# Patient Record
Sex: Female | Born: 1945 | Race: White | Hispanic: No | Smoking: Never smoker
Health system: Southern US, Community
[De-identification: ages and names within clinical notes are randomized; demographics above are authoritative.]

## PROBLEM LIST (undated history)

## (undated) DIAGNOSIS — E079 Disorder of thyroid, unspecified: Secondary | ICD-10-CM

## (undated) HISTORY — DX: Disorder of thyroid, unspecified: E07.9

---
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
## 1991-04-26 HISTORY — PX: ABDOMINAL HYSTERECTOMY: SHX81

## 2000-07-25 ENCOUNTER — Encounter: Payer: Self-pay | Admitting: Obstetrics and Gynecology

## 2000-07-25 ENCOUNTER — Encounter: Admission: RE | Admit: 2000-07-25 | Discharge: 2000-07-25 | Payer: Self-pay | Admitting: Obstetrics and Gynecology

## 2000-09-13 ENCOUNTER — Other Ambulatory Visit: Admission: RE | Admit: 2000-09-13 | Discharge: 2000-09-13 | Payer: Self-pay | Admitting: Obstetrics and Gynecology

## 2001-09-21 ENCOUNTER — Other Ambulatory Visit: Admission: RE | Admit: 2001-09-21 | Discharge: 2001-09-21 | Payer: Self-pay | Admitting: Obstetrics and Gynecology

## 2003-09-01 ENCOUNTER — Encounter: Admission: RE | Admit: 2003-09-01 | Discharge: 2003-09-01 | Payer: Self-pay | Admitting: Obstetrics and Gynecology

## 2008-03-12 ENCOUNTER — Encounter: Admission: RE | Admit: 2008-03-12 | Discharge: 2008-03-12 | Payer: Self-pay | Admitting: Family Medicine

## 2008-03-19 ENCOUNTER — Encounter: Admission: RE | Admit: 2008-03-19 | Discharge: 2008-03-19 | Payer: Self-pay | Admitting: Family Medicine

## 2008-10-29 ENCOUNTER — Encounter: Admission: RE | Admit: 2008-10-29 | Discharge: 2008-10-29 | Payer: Self-pay | Admitting: Obstetrics and Gynecology

## 2008-11-04 ENCOUNTER — Encounter: Admission: RE | Admit: 2008-11-04 | Discharge: 2008-11-04 | Payer: Self-pay | Admitting: Obstetrics and Gynecology

## 2009-12-04 IMAGING — CR DG LUMBAR SPINE COMPLETE 4+V
6 series · 6 of 6 positions shown · non-contrast
Comparison: None

CLINICAL DATA: Pain from the buttocks to feet bilaterally.  Low
back pain.

LUMBAR SPINE - COMPLETE 4+ VIEW

[view not recorded (1 of 6)]
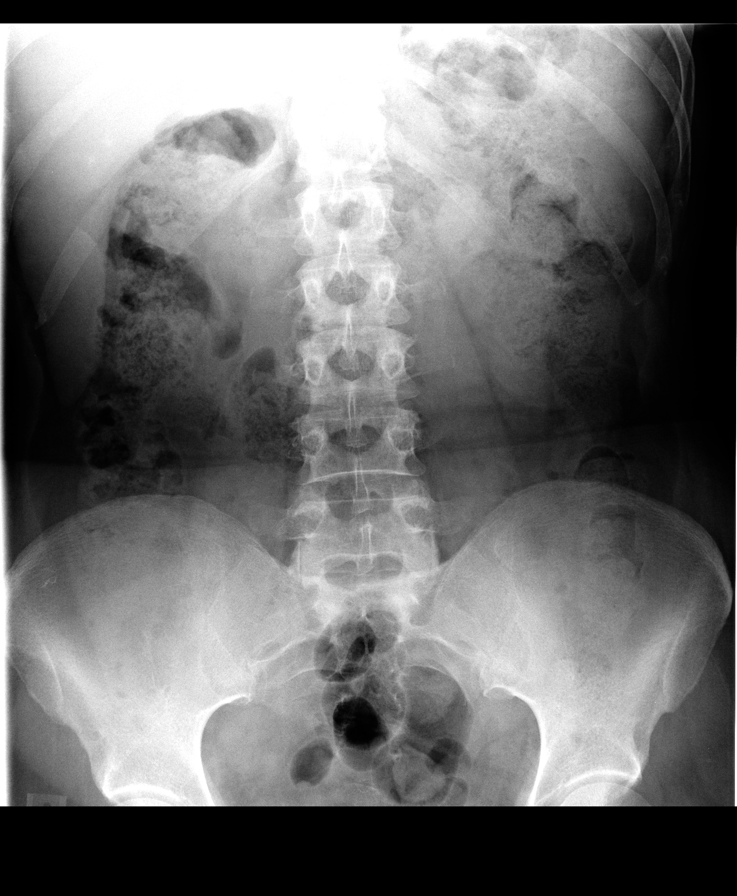

[view not recorded (2 of 6)]
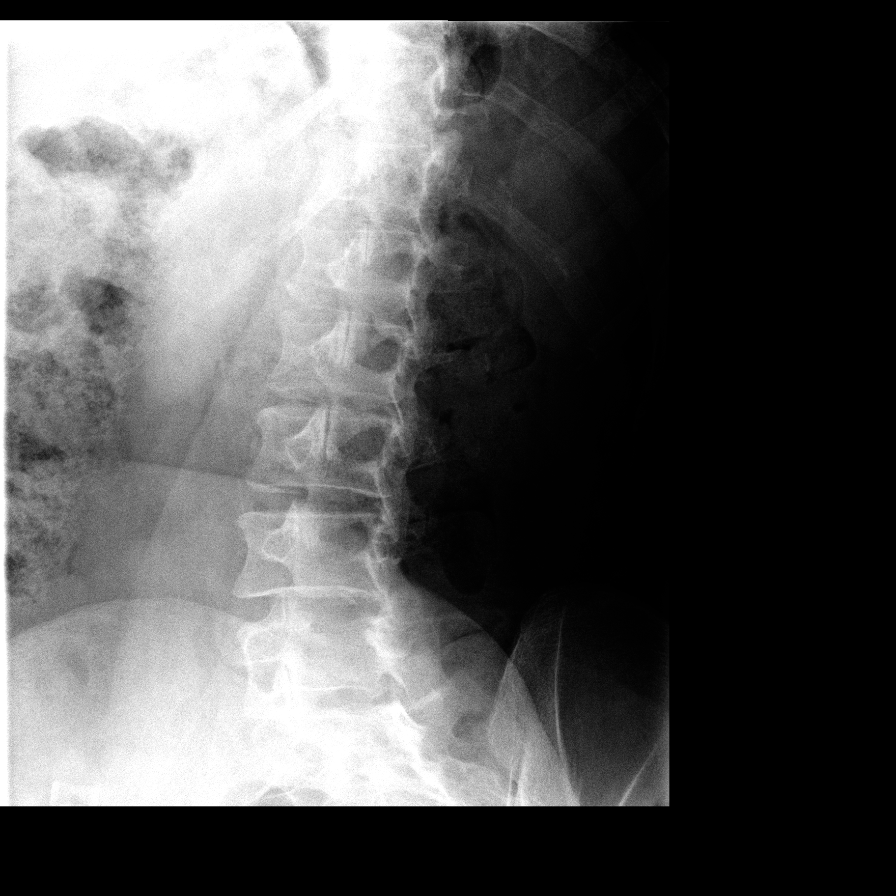

[view not recorded (3 of 6)]
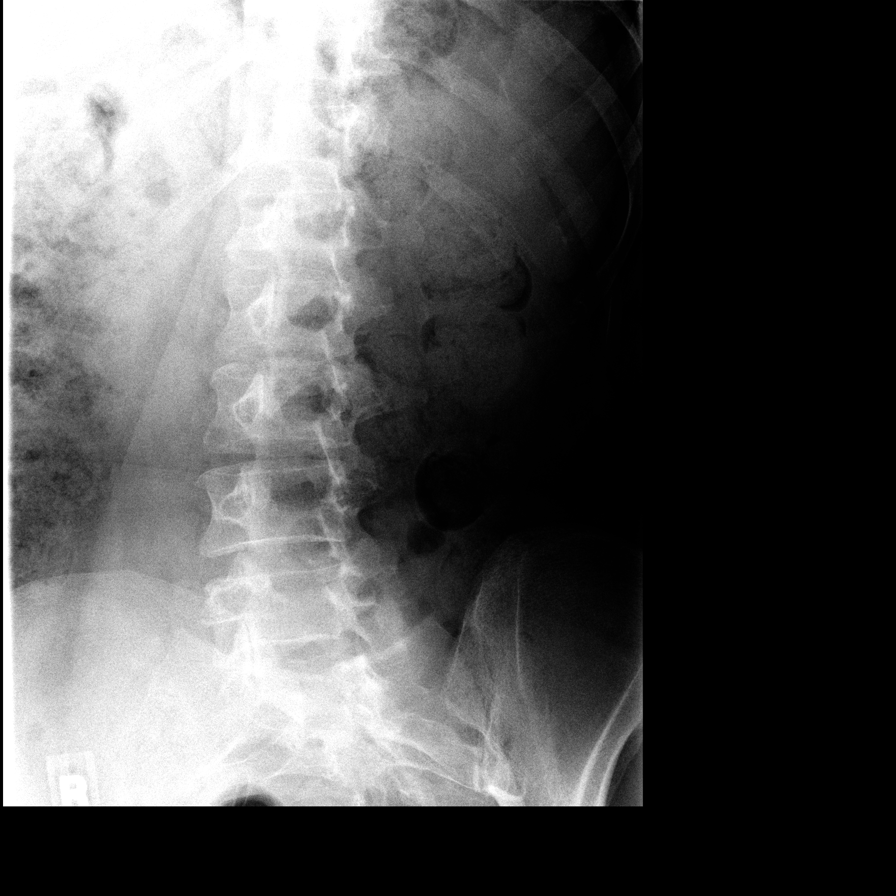

[view not recorded (4 of 6)]
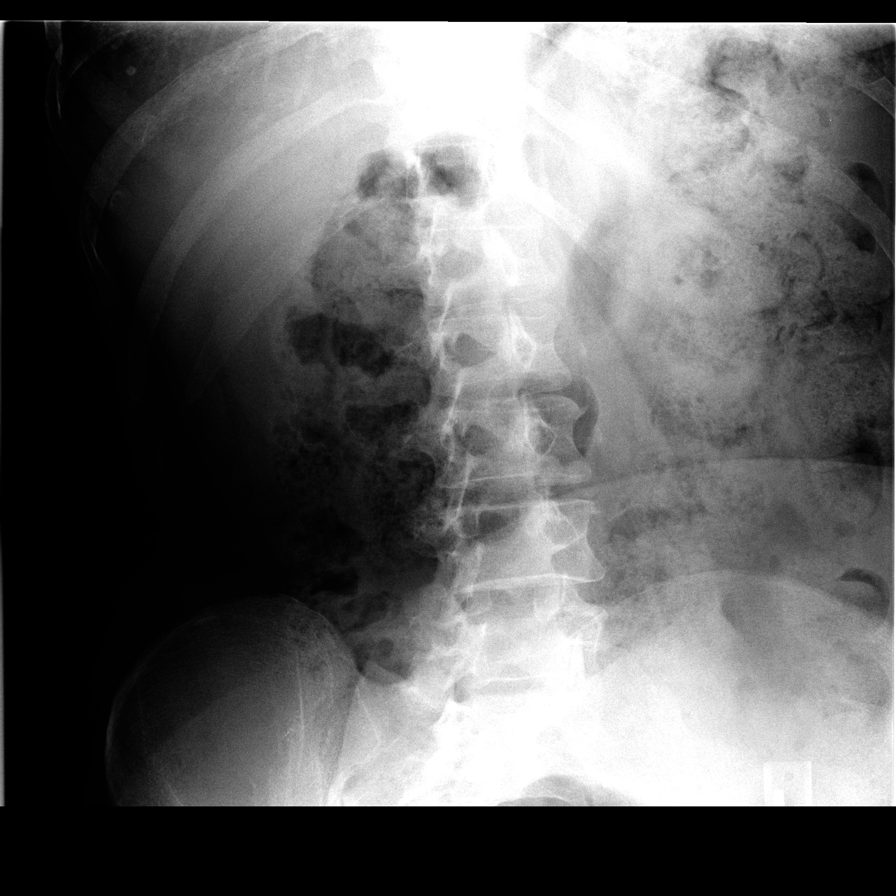

[view not recorded (5 of 6)]
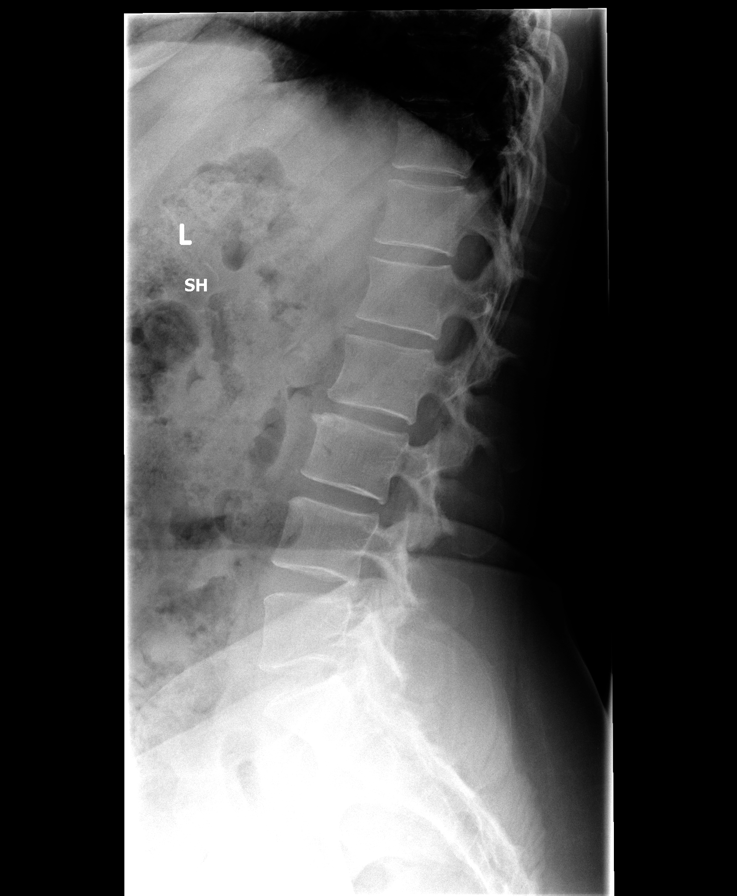

[view not recorded (6 of 6)]
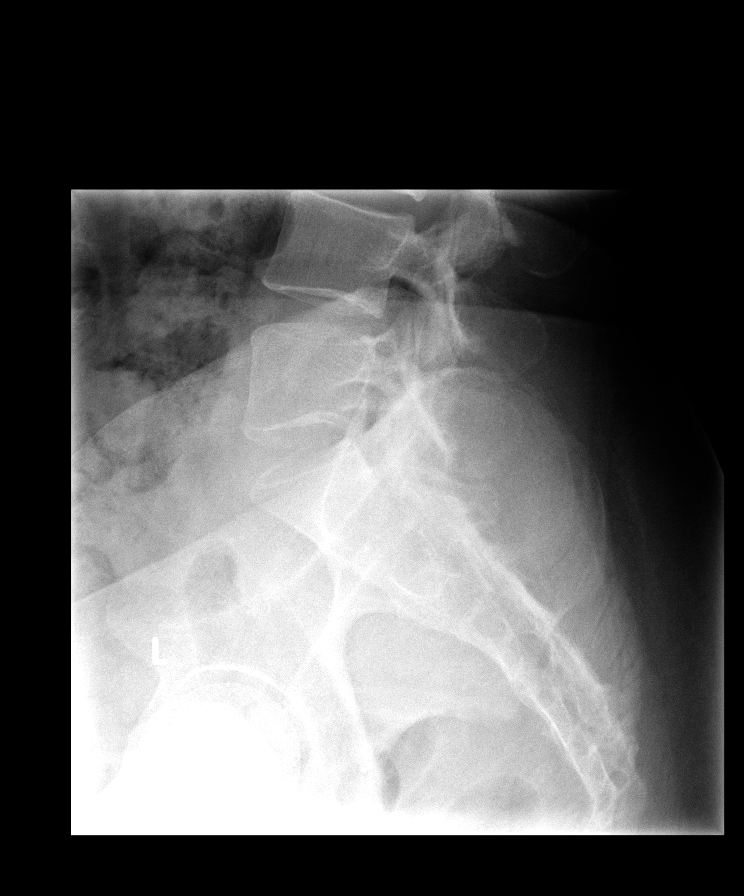

[6 of 6 positions shown; findings below may reference images not displayed]

FINDINGS: Alignment anatomic.  Vertebral body height is maintained.
There may be slight narrowing of the posterior disc space at L5-S1.
Disc space height is otherwise maintained.  Mild endplate
degenerative changes and facet sclerosis are scattered in the
lumbar spine.  No definite pars defects.
IMPRESSION: Spondylosis, as above.

## 2010-04-13 ENCOUNTER — Encounter
Admission: RE | Admit: 2010-04-13 | Discharge: 2010-04-13 | Payer: Self-pay | Source: Home / Self Care | Attending: Obstetrics and Gynecology | Admitting: Obstetrics and Gynecology

## 2010-05-17 ENCOUNTER — Encounter: Payer: Self-pay | Admitting: Obstetrics and Gynecology

## 2011-09-02 ENCOUNTER — Other Ambulatory Visit: Payer: Self-pay | Admitting: Obstetrics and Gynecology

## 2011-09-02 DIAGNOSIS — Z1231 Encounter for screening mammogram for malignant neoplasm of breast: Secondary | ICD-10-CM

## 2011-09-13 ENCOUNTER — Ambulatory Visit
Admission: RE | Admit: 2011-09-13 | Discharge: 2011-09-13 | Disposition: A | Discharge status: Home / Self Care | Payer: Medicare Other | Source: Ambulatory Visit | Attending: Obstetrics and Gynecology | Admitting: Obstetrics and Gynecology

## 2011-09-13 DIAGNOSIS — Z1231 Encounter for screening mammogram for malignant neoplasm of breast: Secondary | ICD-10-CM

## 2012-06-15 ENCOUNTER — Other Ambulatory Visit: Payer: Self-pay | Admitting: Family Medicine

## 2012-06-15 DIAGNOSIS — E2839 Other primary ovarian failure: Secondary | ICD-10-CM

## 2012-09-13 ENCOUNTER — Other Ambulatory Visit: Payer: Medicare Other

## 2012-09-13 ENCOUNTER — Ambulatory Visit: Payer: Medicare Other

## 2012-09-19 ENCOUNTER — Ambulatory Visit
Admission: RE | Admit: 2012-09-19 | Discharge: 2012-09-19 | Disposition: A | Discharge status: Home / Self Care | Payer: Medicare Other | Source: Ambulatory Visit | Attending: Family Medicine | Admitting: Family Medicine

## 2012-09-19 DIAGNOSIS — E2839 Other primary ovarian failure: Secondary | ICD-10-CM

## 2012-09-19 DIAGNOSIS — Z1231 Encounter for screening mammogram for malignant neoplasm of breast: Secondary | ICD-10-CM

## 2013-01-04 ENCOUNTER — Other Ambulatory Visit: Payer: Self-pay | Admitting: Family Medicine

## 2013-01-04 DIAGNOSIS — R209 Unspecified disturbances of skin sensation: Secondary | ICD-10-CM

## 2013-01-08 ENCOUNTER — Ambulatory Visit
Admission: RE | Admit: 2013-01-08 | Discharge: 2013-01-08 | Disposition: A | Discharge status: Home / Self Care | Payer: Medicare PPO | Source: Ambulatory Visit | Attending: Family Medicine | Admitting: Family Medicine

## 2013-01-08 DIAGNOSIS — R209 Unspecified disturbances of skin sensation: Secondary | ICD-10-CM

## 2013-01-21 ENCOUNTER — Ambulatory Visit (INDEPENDENT_AMBULATORY_CARE_PROVIDER_SITE_OTHER): Payer: Medicare PPO | Admitting: Neurology

## 2013-01-21 ENCOUNTER — Encounter: Payer: Self-pay | Admitting: Neurology

## 2013-01-21 VITALS — BP 118/76 | HR 76 | Temp 97.8°F | Resp 18 | Ht 63.0 in | Wt 141.0 lb

## 2013-01-21 DIAGNOSIS — R209 Unspecified disturbances of skin sensation: Secondary | ICD-10-CM

## 2013-01-21 DIAGNOSIS — G5600 Carpal tunnel syndrome, unspecified upper limb: Secondary | ICD-10-CM | POA: Insufficient documentation

## 2013-01-21 MED ORDER — GABAPENTIN 300 MG PO CAPS: ORAL_CAPSULE | ORAL | Status: DC

## 2013-01-21 NOTE — Patient Instructions (Addendum)
1.  EMG of left upper extremity 2.  Start gabapentin 300mg  at bedtime, take one tablet x 3 days, then take two tablets at bedtime thereafter 3.  Wrist splint at bedtime  4.  Return to clinic in 3 weeks

## 2013-01-21 NOTE — Progress Notes (Signed)
[facility] Clinic Note - Initial Visit   Date: 01/21/2013   Brenda Huffman MRN: [id number] DOB: 01-04-1946   Dear Dr Nathanial Rancher:  Thank you for your kind referral of Brenda Huffman for consultation of left arm pain. Although her history is well known to you, please allow [place] to reiterate it for the purpose of our medical record. The patient was accompanied to the clinic by self.   History of Present Illness: Brenda Huffman is a 67 y.o. year-old right-handed Caucasian female with history of hypothyroidism presenting for evaluation of left arm pain and numbness. Early September 2014, she developed relatively abrupt onset of numbness involving the left arm from the elbow into her hands. Numbness is constant and worse distal to forearm and her hand feels tight. She has associated swelling of the left thumb. Symptoms are worse at night, because of increased pain and tingling, described as if the "hand is on fire".  She has tried Aleve without any relief.  When severe, her pain is ranked as 10/10.  Denies any alleviating factors.  When she wakes up at night, she tries to sit in a recliner and it may relieve the pain slightly but it always comes back.  She denies any neck pain, shooting sensation, or hand weakness.  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    Out-side paper records, electronic medical record, and images have been reviewed where available and summarized as:  MRI cervical spine wwo contrast 01/08/2013: 1. 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  2. Shallow central and left paracentral protrusion C6-7 disc just contacts the cord without cord deformity.  3. Shallow central protrusion at C5-6 just contacts the cord without cord deformity.  Labs:  TSH  0.380    Past Medical History  Diagnosis Date  . Thyroid disease      Past Surgical History  Procedure Laterality Date  . Abdominal hysterectomy  1993  . Appendectomy  1964     Medications:  Synthroid daily  Allergies: No Known Allergies  Family History: Family History  Problem Relation Age of Onset  . Dementia Mother   . Dementia Father   . Stroke Mother     Died, 87  . Stroke Father     Died, 33s  . Healthy Sister   . Healthy Brother     Social History: History   Social History  . Marital Status: Married    Spouse Name: N/A    Number of Children: N/A  . Years of Education: N/A   Occupational History  . Not on file.   Social History Main Topics  . Smoking status: Never Smoker   . Smokeless tobacco: Never Used  . Alcohol Use: No  . Drug Use: No  . Sexual Activity: Not on file   Other Topics Concern  . 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   Lives with husband.  They have two healthy sons.    Review of Systems:  CONSTITUTIONAL: No fevers, chills, night sweats, or weight loss.   EYES: No visual changes or eye pain ENT: No hearing changes.  No history of nose bleeds.   RESPIRATORY: No cough, wheezing and shortness of breath.   CARDIOVASCULAR: Negative for chest pain, and palpitations.   GI: Negative for abdominal discomfort, blood in stools  or black stools.  No recent change in bowel habits.   GU:  No history of incontinence.   MUSCLOSKELETAL: + joint swelling.  No myalgias.   SKIN: Negative for lesions, rash, and itching.   HEMATOLOGY/ONCOLOGY: Negative for prolonged bleeding, bruising easily, and swollen nodes.  No history of cancer.   ENDOCRINE: Negative for cold or heat intolerance, polydipsia or goiter.   PSYCH:  No depression or anxiety symptoms.   NEURO: As Above.   Vital Signs:  BP 118/76  Pulse 76  Temp(Src) 97.8 F (36.6 C)  Resp 18  Ht 5\' 3"  (1.6 m)  Wt 141 lb (63.957 kg)  BMI 24.98 kg/m2 Pain Scale: 2 on a scale of  0-10   General Medical Exam:   General:  Well appearing, comfortable.  Blunted affect. Eyes/ENT: see cranial nerve examination.   Neck: No masses appreciated.  Full range of motion without tenderness.   Respiratory:  Clear to auscultation, good air entry bilaterally.   Cardiac:  Regular rate and rhythm, no murmur.   GI:  Soft, non-tender, non-distended abdomen.  Bowel sounds normal.  Back:  No pain to palpation of spinous processes.   Extremities:  No deformities, edema, or skin discoloration. Good capillary refill.   Skin:  Skin color, texture, turgor normal. No rashes or lesions.  Neurological Exam: MENTAL STATUS including orientation to time, place, person, recent and remote memory, attention span and concentration, language, and fund of knowledge is normal.  Speech is not dysarthric.  CRANIAL NERVES: II:  No visual field defects.  Unremarkable fundi.   III-IV-VI: Pupils equal round and reactive to light.  Normal conjugate, extra-ocular eye movements in all directions of gaze.  No nystagmus.  No ptosis.   V:  Normal facial sensation.  Jaw jerk is present.   VII:  Normal facial symmetry and movements.  Snout and Myerson's reflexes present.   VIII:  Normal hearing and vestibular function.   IX-X:  Normal palatal movement.   XI:  Normal shoulder shrug and head rotation.   XII:  Normal tongue strength and range of motion, no deviation or fasciculation.  MOTOR:  No atrophy or fasciculations.  Head tremor noted at rest.  No limb tremors seen.  No pronator drift.  Negative Tinel's sign and Prayer sign.  Right Upper Extremity:    Left Upper Extremity:    Deltoid  5/5   Deltoid  5/5   Biceps  5/5   Biceps  5/5   Triceps  5/5   Triceps  5/5   Wrist extensors  5/5   Wrist extensors  5/5   Wrist flexors  5/5   Wrist flexors  5/5   Finger extensors  5/5   Finger extensors  5/5   Finger flexors  5/5   Finger flexors  5/5   Dorsal interossei  5/5   Dorsal interossei  5/5   Abductor pollicis   4/5   Abductor pollicis  4/5   Tone (Ashworth scale)  0+  Tone (Ashworth scale)  0   Right Lower Extremity:    Left Lower Extremity:    Hip flexors  5/5   Hip flexors  5/5   Hip extensors  5/5   Hip extensors  5/5   Knee flexors  5/5   Knee flexors  5/5   Knee extensors  5/5   Knee extensors  5/5   Dorsiflexors  5/5   Dorsiflexors  5/5   Plantarflexors  5/5   Plantarflexors  5/5  Toe extensors  5/5   Toe extensors  5/5   Toe flexors  5/5   Toe flexors  5/5   Tone (Ashworth scale)  0  Tone (Ashworth scale)  0   MSRs:  Right                                                                 Left brachioradialis 2+  brachioradialis 2+  biceps 2+  biceps 2+  triceps 2+  triceps 2+  patellar 2+  patellar 2+  ankle jerk 2+  ankle jerk 2+  Hoffman no  Hoffman no  plantar response down  plantar response down   SENSORY:  Normal and symmetric perception of light touch, pinprick, vibration, and proprioception.  Romberg's sign absent.   COORDINATION/GAIT: Normal finger-to- nose-finger and heel-to-shin.  Mild bradykinesia with reduced amplitude on the right upper extremity with finger tapping.    Able to rise from a chair without using arms.  Gait narrow based and stable. Tandem and stressed gait intact.    IMPRESSION: Brenda Huffman is a 67 year-old female presenting for evaluation of left arm pain and numbness.  Her clinical examination reveals bilateral ABP weakness (4/5) with normal sensation and reflexes.  Imaging has been reviewed and shows moderate bilateral foraminal narrowing at C4-5, but this level of nerve impingement would not explains symptoms in her forearm and hands.  I would like to obtain and EMG to better localize her symptoms and evaluate for median neuropathy at the wrist.  Given her ABP weakness and worsening symptoms at night, I would like for her to start using a wrist splint at bedtime.  For pain management, I have recommended gabapentin at bedtime.   Of note, she has masked  facies, resting head tremor, very mild increase in tone on the right side and bradykinesia.  She denies any loss of smell, vivid dreams, or constipation.  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  Symptoms are not interfering with her daily life.  Head tremor has been present for two years and she was seen by [facility] in the past with negative work-up.   PLAN/RECOMMENDATIONS:  1.  EMG of left upper extremity 2.  Start gabapentin 300mg  at bedtime, take one tablet x 3 days, then take two tablets at bedtime thereafter 3.  Wrist splint at bedtime to prevent wrist hyperflexion 4.  Return to clinic in 3 weeks  The duration of this appointment visit was 60 minutes of face-to-face time with the patient.  Greater than 50% of this time was spent in counseling, explanation of diagnosis, planning of further management, and coordination of care.   Thank you for allowing me to participate in patient's care.  If I can answer any additional questions, I would be pleased to do so.    Sincerely,    Tkeya Stencil K. Allena Katz, DO

## 2013-02-04 ENCOUNTER — Ambulatory Visit (INDEPENDENT_AMBULATORY_CARE_PROVIDER_SITE_OTHER): Payer: Medicare PPO | Admitting: Neurology

## 2013-02-04 ENCOUNTER — Encounter: Payer: Self-pay | Admitting: Neurology

## 2013-02-04 DIAGNOSIS — G5602 Carpal tunnel syndrome, left upper limb: Secondary | ICD-10-CM

## 2013-02-04 DIAGNOSIS — G5600 Carpal tunnel syndrome, unspecified upper limb: Secondary | ICD-10-CM

## 2013-02-04 NOTE — Procedures (Signed)
[facility]  [address] [place], [address]  [place], [place] [address] Tel: [phone] Fax:  [phone] Test Date:  02/04/2013  Patient: Brenda Huffman DOB: 1946/03/01 Physician: Nita Sickle, DO  Sex: Female Height: ' " Ref Phys:   ID#: [id number] Temp:  32.6C Technician:    Patient Complaints: 67 year-old female with 6-week history of left arm pain and numbness/tingling.  NCV & EMG Findings: Extensive evaluation of the left upper extremity reveals: 1. Left median motor nerve showed prolonged distal onset latency (6.3 ms) and reduced amplitude (3.1 mV).  The left median sensory nerve showed prolonged distal peak latency (4.0 ms) and reduced amplitude (8.0 V).  All remaining nerves (as indicated in the following tables) were within normal limits.   2. Chronic motor axon loss changes are isolated to the abductor pollicis brevis without evidence of active denervation.   Impression: Left median neuropathy, at or distal to the wrist, consistent with clinical diagnosis of carpal tunnel syndrome, predominately affecting motor fibers.  Overall, these changes are moderately severe in degree electrically.   ___________________________ Nita Sickle, DO    Nerve Conduction Studies Anti Sensory Summary Table   Site NR Peak (ms) Norm Peak (ms) P-T Amp (V) Norm P-T Amp  Left Median Anti Sensory (2nd Digit)  Wrist    4.0 <3.8 8.0 >10  Left Radial Anti Sensory (Base 1st Digit)  Wrist    2.4 <2.8 17.4 >10  Left Ulnar Anti Sensory (5th Digit)  Wrist    2.7 <3.2 16.2 >5   Motor Summary Table   Site NR Onset (ms) Norm Onset (ms) O-P Amp (mV) Norm O-P Amp Site1 Site2 Delta-0 (ms) Dist (cm) Vel (m/s) Norm Vel (m/s)  Left Median Motor (Abd Poll Brev)  Wrist    6.3 <4.0 3.1 >5 Elbow Wrist 5.8 29.0 50 >50  Elbow    12.1  2.9         Left Ulnar Motor (Abd Dig Minimi)  Wrist    2.1 <3.1 8.4 >7 B Elbow Wrist 3.4 21.0 62 >50  B Elbow    5.5  7.4  A Elbow B Elbow 1.9 10.0 53 >50  A  Elbow    7.4  7.0          EMG   Side Muscle Ins Act Fibs Psw Fasc Number Recrt Dur Dur. Amp Amp. Poly Poly. Comment  Left 1stDorInt Nml Nml Nml Nml Nml Nml Nml Nml Nml Nml Nml Nml N/A  Left Abd Poll Brev Nml Nml Nml Nml 2- Mod-R Some 1+ Nml Nml Nml Nml N/A  Left FlexPolLong Nml Nml Nml Nml Nml Nml Nml Nml Nml Nml Nml Nml N/A  Left Ext Indicis Nml Nml Nml Nml Nml Nml Nml Nml Nml Nml Nml Nml N/A  Left PronatorTeres Nml Nml Nml Nml Nml Nml Nml Nml Nml Nml Nml Nml N/A  Left Biceps Nml Nml Nml Nml Nml Nml Nml Nml Nml Nml Nml Nml N/A  Left Triceps Nml Nml Nml Nml Nml Nml Nml Nml Nml Nml Nml Nml N/A  Left Deltoid Nml Nml Nml Nml Nml Nml Nml Nml Nml Nml Nml Nml N/A  Left Cervical Parasp Low Nml Nml Nml Nml Nml Nml Nml Nml Nml Nml Nml Nml N/A      Waveforms:

## 2013-02-04 NOTE — Progress Notes (Signed)
See procedure note for EMG results.  Brenda Huffman [name], DO  

## 2013-02-15 ENCOUNTER — Ambulatory Visit (INDEPENDENT_AMBULATORY_CARE_PROVIDER_SITE_OTHER): Payer: Medicare PPO | Admitting: Neurology

## 2013-02-15 ENCOUNTER — Encounter: Payer: Self-pay | Admitting: Neurology

## 2013-02-15 VITALS — BP 112/74 | HR 80 | Temp 97.7°F | Resp 16 | Ht 63.0 in | Wt 141.8 lb

## 2013-02-15 DIAGNOSIS — G5602 Carpal tunnel syndrome, left upper limb: Secondary | ICD-10-CM

## 2013-02-15 DIAGNOSIS — G5600 Carpal tunnel syndrome, unspecified upper limb: Secondary | ICD-10-CM

## 2013-02-15 NOTE — Patient Instructions (Addendum)
1.  Chest x-ray 2.  Referral to orthopaedic surgery for carpal tunnel syndrome 3.  Increase neurontin to 900mg  at bedtime 4.  Continue to use wrist splint at bedtime 5.  Return to clinic in 4 weeks

## 2013-02-15 NOTE — Progress Notes (Signed)
[facility] Neurology Division  Follow-up Visit   Date: 02/15/2013   Brenda Huffman MRN: [id number] DOB: 19-Nov-1945   Interim History: Brenda Huffman is a 67 y.o. year-old right-handed Caucasian  female with history of hypothyroidism returning to the clinic for follow-up of left arm pain and numbness.  She was last seen in the office on 01/21/2013. The patient was accompanied to the clinic by self.   Since then, she tried using a wrist splint at night, but woke up and took it off because it was too uncomfortable.  She continues to have paresthesias of her left hand which is worse when she drives, overuse, and at night. Symptoms are much worse at night because it feels like the hand is on fire and occurs around 2-3am.  She will try to sit up and stretch hand, which resolves the severe discomfort.  She feels that she is unable to open jars with that hand as well as she used to be able to.   She is taking neurontin 600mg  at bedtime without any noticeable benefit.  She is not having excessive drowsiness.    History of present illness: Early September 2014, she developed relatively abrupt onset of numbness involving the left arm from the elbow into her hands. Numbness is constant and worse distal to forearm and her hand feels tight. She has associated swelling of the left thumb. Symptoms are worse at night, because of increased pain and tingling, described as if the "hand is on fire". She has tried Aleve without any relief. When severe, her pain is ranked as 10/10. Denies any alleviating factors. When she wakes up at night, she tries to sit in a recliner and it may relieve the pain slightly but it always comes back. She denies any neck pain, shooting sensation, or hand weakness.   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    Medications:  Current Outpatient Prescriptions on File Prior to  Visit  Medication Sig Dispense Refill  . gabapentin (NEURONTIN) 300 MG capsule Take one tablet at bedtime x 3 days, then take two tablets at bedtime thereafter.  60 capsule  3  . levothyroxine (SYNTHROID, LEVOTHROID) 137 MCG tablet Take 137 mcg by mouth daily before breakfast.       No current facility-administered medications on file prior to visit.    Allergies: No Known Allergies   Review of Systems:  CONSTITUTIONAL: No fevers, chills, night sweats, or weight loss.   EYES: No visual changes or eye pain ENT: No hearing changes.  No history of nose bleeds.   RESPIRATORY: No cough, wheezing and shortness of breath.   CARDIOVASCULAR: Negative for chest pain, and palpitations.   GI: Negative for abdominal discomfort, blood in stools or black stools.  GU:  No history of incontinence.   MUSCLOSKELETAL: No history of joint pain or swelling.  No myalgias.   SKIN: Negative for lesions, rash, and itching.   HEMATOLOGY/ONCOLOGY: Negative for prolonged bleeding, bruising easily, and swollen nodes.    ENDOCRINE: Negative for cold or heat intolerance, polydipsia or goiter.   PSYCH:  No depression or anxiety symptoms.   NEURO: As Above.   Vital Signs:  BP 112/74  Pulse 80  Temp(Src) 97.7 F (36.5 C)  Resp 16  Ht 5\' 3"  (1.6 m)  Wt 141 lb 12.8 oz (64.32 kg)  BMI 25.13 kg/m2 Pain Scale: 1 on a scale of 0-10  Neurological Exam: MENTAL STATUS including orientation to time, place, person, recent and remote memory, attention span and concentration, language, and fund of knowledge is normal. Speech is not dysarthric.  Blunted affect.  CRANIAL NERVES:  II: No visual field defects. Unremarkable fundi.  III-IV-VI: Pupils equal round and reactive to light. Normal conjugate, extra-ocular eye movements in all directions of gaze. No nystagmus. No ptosis.  V: Normal facial sensation. Jaw jerk is present.  VII: Normal facial symmetry and movements. Snout and Myerson's reflexes present.  VIII:  Normal hearing and vestibular function.  IX-X: Normal palatal movement.  XI: Normal shoulder shrug and head rotation.  XII: Normal tongue strength and range of motion, no deviation or fasciculation.   MOTOR: No atrophy or fasciculations. Head tremor noted at rest. No limb tremors seen. No pronator drift. Tinel's sign and Prayer sign present.  Right Upper Extremity:    Left Upper Extremity:    Deltoid  5/5   Deltoid  5/5   Biceps  5/5   Biceps  5/5   Triceps  5/5   Triceps  5/5   Wrist extensors  5/5   Wrist extensors  5/5   Wrist flexors  5/5   Wrist flexors  5/5   Finger extensors  5/5   Finger extensors  5/5   Finger flexors  5/5   Finger flexors  5/5   Dorsal interossei  5/5   Dorsal interossei  5/5   Abductor pollicis  4/5   Abductor pollicis  4/5   Tone (Ashworth scale)  0+   Tone (Ashworth scale)  0    Right Lower Extremity:    Left Lower Extremity:    Hip flexors  5/5   Hip flexors  5/5   Hip extensors  5/5   Hip extensors  5/5   Knee flexors  5/5   Knee flexors  5/5   Knee extensors  5/5   Knee extensors  5/5   Dorsiflexors  5/5   Dorsiflexors  5/5   Plantarflexors  5/5   Plantarflexors  5/5   Toe extensors  5/5   Toe extensors  5/5   Toe flexors  5/5   Toe flexors  5/5   Tone (Ashworth scale)  0   Tone (Ashworth scale)  0    MSRs:  Right Left  brachioradialis  2+   brachioradialis  2+   biceps  2+   biceps  2+   triceps  2+   triceps  2+   patellar  2+   patellar  2+   ankle jerk  2+   ankle jerk  2+   Hoffman  no   Hoffman  no   plantar response  down   plantar response  down    SENSORY: Normal and symmetric perception of light touch, pinprick, vibration, and proprioception. Romberg's sign absent.   COORDINATION/GAIT: Normal finger-to- nose-finger and heel-to-shin. Mild bradykinesia with reduced amplitude on the right upper extremity with finger tapping. Able to rise from a chair without using arms. Gait narrow based and stable. Tandem and stressed gait intact.     Data: MRI cervical spine wwo contrast 01/08/2013: 1. 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  2. Shallow central and left paracentral protrusion C6-7 disc just contacts the cord without cord deformity.  3. Shallow central protrusion at C5-6 just contacts the cord without cord deformity.    EMG 02/04/2013: Left median neuropathy, at or distal to the wrist, consistent with clinical diagnosis  of carpal tunnel syndrome, predominately affecting motor fibers. Overall, these changes are moderately severe in degree electrically.  Labs: TSH 0.380  IMPRESSION: Ms. Brittingham is a 67 year-old with left hand paresthesias and weakness.  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  There are two characteristics of her history which is atypical for median neuropathy at the wrist: (1) abrupt onset of symptoms and (2) sensory symptoms involving the medial forearm.  Examination does not reveal and sensory deficits, but there is weakness of bilateral ABPs.  I would like to obtain CXR to make sure I am not missing neurogenic thoracic outlet syndrome because of her atypical findings and motor > sensory changes on EMG.  Further, I will refer the patient to orthopeadic surgery for steroid injection and/or evaluation for possible CTS release if symptoms do not improve with conservative measures.   PLAN/RECOMMENDATIONS:  1.  Chest x-ray 2.  Referral to orthopaedic surgery for carpal tunnel syndrome 3.  Increase neurontin to 900mg  at bedtime 4.  Continue to use wrist splint at bedtime 5.  Return to clinic in 4 weeks   The duration of this appointment visit was 30 minutes of face-to-face time with the patient.  Greater than 50% of this time was spent in counseling, explanation of diagnosis, planning of further management, and coordination of care.   Thank you for allowing me to participate in patient's care.  If I can answer any additional questions, I would be pleased to  do so.    Sincerely,    Donika K. Allena Katz, DO

## 2013-02-18 ENCOUNTER — Telehealth: Payer: Self-pay | Admitting: Neurology

## 2013-02-18 ENCOUNTER — Ambulatory Visit (HOSPITAL_COMMUNITY)
Admission: RE | Admit: 2013-02-18 | Discharge: 2013-02-18 | Disposition: A | Discharge status: Home / Self Care | Payer: Medicare PPO | Source: Ambulatory Visit | Attending: Neurology | Admitting: Neurology

## 2013-02-18 DIAGNOSIS — G5602 Carpal tunnel syndrome, left upper limb: Secondary | ICD-10-CM

## 2013-02-18 DIAGNOSIS — M79609 Pain in unspecified limb: Secondary | ICD-10-CM | POA: Insufficient documentation

## 2013-02-18 DIAGNOSIS — R209 Unspecified disturbances of skin sensation: Secondary | ICD-10-CM | POA: Insufficient documentation

## 2013-02-18 NOTE — Telephone Encounter (Signed)
CXR looks normal, called to inform patient but there was no answer.  Message left.  Hawk Mones K. Allena Katz, DO

## 2013-02-22 ENCOUNTER — Telehealth: Payer: Self-pay | Admitting: Neurology

## 2013-02-22 NOTE — Telephone Encounter (Signed)
[facility]  Dr Cleophas Dunker 03/08/13 1015 am arrive 10 am  The patient has been notified of this information and all questions answered. [address] [place] [address]

## 2013-03-15 ENCOUNTER — Ambulatory Visit: Payer: Medicare PPO | Admitting: Neurology

## 2013-04-10 ENCOUNTER — Encounter: Payer: Self-pay | Admitting: Neurology

## 2013-04-10 ENCOUNTER — Ambulatory Visit (INDEPENDENT_AMBULATORY_CARE_PROVIDER_SITE_OTHER): Payer: Medicare PPO | Admitting: Neurology

## 2013-04-10 VITALS — BP 110/60 | HR 78 | Temp 97.6°F | Ht 63.0 in | Wt 140.2 lb

## 2013-04-10 DIAGNOSIS — G5602 Carpal tunnel syndrome, left upper limb: Secondary | ICD-10-CM

## 2013-04-10 DIAGNOSIS — G5600 Carpal tunnel syndrome, unspecified upper limb: Secondary | ICD-10-CM

## 2013-04-10 DIAGNOSIS — G250 Essential tremor: Secondary | ICD-10-CM

## 2013-04-10 NOTE — Progress Notes (Signed)
[facility] Neurology Division  Follow-up Visit   Date: 04/10/2013   Brenda Huffman MRN: [id number] DOB: 07/13/1945   Interim History: Brenda Huffman is a 67 y.o. year-old right-handed Caucasian  female with history of hypothyroidism returning to the clinic for follow-up of left arm pain and numbness.  She was last seen in the office on 02/15/2013. The patient was accompanied to the clinic by self.   She saw Dr. Cleophas Dunker (orthopeadic surgery) in November who she really enjoyed meeting.  He administered steroid injection to the left hand and she reports having significant improvement of pain.  She continues to have mild numbness of the left fingers.  She stopped neurontin and has not noticed any recurrence or worsening of symptoms.  Denies any new weakness.   History of present illness: Early September 2014, she developed relatively abrupt onset of numbness involving the left arm from the elbow into her hands. Numbness is constant and worse distal to forearm and her hand feels tight. She has associated swelling of the left thumb. Symptoms were worse at night, because of increased pain and tingling, described as if the "hand is on fire". She has tried Aleve without any relief.  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    10/24 follow-up: EMG showed severe CTS, neurontin increased to 900mg  qhs and recommended wrist splint, but did not tolerate.    Medications:  Current Outpatient Prescriptions on File Prior to Visit  Medication Sig Dispense Refill  . levothyroxine (SYNTHROID, LEVOTHROID) 137 MCG tablet Take 137 mcg by mouth daily before breakfast.       No current facility-administered medications on file prior to visit.    Allergies: No Known Allergies   Review of Systems:  CONSTITUTIONAL: No fevers, chills, night sweats, or weight loss.   EYES: No visual changes or eye  pain ENT: No hearing changes.  No history of nose bleeds.   RESPIRATORY: No cough, wheezing and shortness of breath.   CARDIOVASCULAR: Negative for chest pain, and palpitations.   GI: Negative for abdominal discomfort, blood in stools or black stools.  GU:  No history of incontinence.   MUSCLOSKELETAL: No history of joint pain or swelling.  No myalgias.   SKIN: Negative for lesions, rash, and itching.   HEMATOLOGY/ONCOLOGY: Negative for prolonged bleeding, bruising easily, and swollen nodes.    ENDOCRINE: Negative for cold or heat intolerance, polydipsia or goiter.   PSYCH:  No depression or anxiety symptoms.   NEURO: As Above.   Vital Signs:  BP 110/60  Pulse 78  Temp(Src) 97.6 F (36.4 C) (Oral)  Ht 5\' 3"  (1.6 m)  Wt 140 lb 3.2 oz (63.594 kg)  BMI 24.84 kg/m2 Pain Scale: 1 on a scale of 0-10    Neurological Exam: MENTAL STATUS including orientation to time, place, person, recent and remote memory, attention span and concentration, language, and fund of knowledge is normal. Speech is not dysarthric.  Blunted affect.  CRANIAL NERVES:  Pupils round and reactive to light.  Normal extraocular movements.  Face is symmetric. Mild bilateral ptosis (old). Snout and Myerson's reflexes present.   MOTOR: Motor strength is 5/5 in all extremities except 4/5 in ABP bilaterally.  Mildly increased tone on the right side (0+)   No atrophy or fasciculations. Head tremor noted at rest. No limb tremors seen. No pronator drift.  MSRs:  Reflexes are  2+/4 throughout  SENSORY:  intact  COORDINATION/GAIT: Mild bradykinesia with reduced amplitude on the right upper extremity with finger tapping.  Gait narrow based and stable.    Data: MRI cervical spine wwo contrast 01/08/2013: 1. 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  2. Shallow central and left paracentral protrusion C6-7 disc just contacts the cord without cord deformity.  3. Shallow central protrusion at  C5-6 just contacts the cord without cord deformity.   EMG 02/04/2013: Left median neuropathy, at or distal to the wrist, consistent with clinical diagnosis of carpal tunnel syndrome, predominately affecting motor fibers. Overall, these changes are moderately severe in degree electrically.  Labs: TSH 0.380  IMPRESSION/PLAN: 1.  Left carpal tunnel syndrome, severe  - Underwent trial of steroid injection by Dr. Cleophas Dunker which has significantly helped symptoms  - If symptoms return, she may have repeat steroid injection or plan for carpal tunnel release  - She has stopped neurontin   - Clinically still with mild ABP weakness 2.  Mild parkinsonian features manifested by head tremor, mildly increased tone on the right side with slow movements  - Patient is not bothered by symptoms at this time  - Will continue to follow clinically 3.  I will see her again in 90-months or sooner as needed   The duration of this appointment visit was 20 minutes of face-to-face time with the patient.  Greater than 50% of this time was spent in counseling, explanation of diagnosis, planning of further management, and coordination of care.   Thank you for allowing me to participate in patient's care.  If I can answer any additional questions, I would be pleased to do so.    Sincerely,    Donika K. Allena Katz, DO

## 2013-04-10 NOTE — Patient Instructions (Signed)
Return to clinic in 6 months or sooner as needed  

## 2013-07-04 ENCOUNTER — Other Ambulatory Visit: Payer: Self-pay | Admitting: Family Medicine

## 2013-07-04 DIAGNOSIS — Z1231 Encounter for screening mammogram for malignant neoplasm of breast: Secondary | ICD-10-CM

## 2013-09-20 ENCOUNTER — Ambulatory Visit: Payer: Medicare PPO

## 2013-09-26 ENCOUNTER — Ambulatory Visit
Admission: RE | Admit: 2013-09-26 | Discharge: 2013-09-26 | Disposition: A | Discharge status: Home / Self Care | Payer: Medicare PPO | Source: Ambulatory Visit | Attending: Family Medicine | Admitting: Family Medicine

## 2013-09-26 DIAGNOSIS — Z1231 Encounter for screening mammogram for malignant neoplasm of breast: Secondary | ICD-10-CM

## 2014-09-11 DIAGNOSIS — H2513 Age-related nuclear cataract, bilateral: Secondary | ICD-10-CM | POA: Diagnosis not present

## 2014-11-10 ENCOUNTER — Other Ambulatory Visit: Payer: Self-pay | Admitting: Family Medicine

## 2014-11-10 DIAGNOSIS — Z1231 Encounter for screening mammogram for malignant neoplasm of breast: Secondary | ICD-10-CM

## 2014-12-15 ENCOUNTER — Ambulatory Visit
Admission: RE | Admit: 2014-12-15 | Discharge: 2014-12-15 | Disposition: A | Discharge status: Home / Self Care | Payer: Medicare PPO | Source: Ambulatory Visit | Attending: Family Medicine | Admitting: Family Medicine

## 2014-12-15 DIAGNOSIS — Z1231 Encounter for screening mammogram for malignant neoplasm of breast: Secondary | ICD-10-CM | POA: Diagnosis not present

## 2014-12-23 ENCOUNTER — Other Ambulatory Visit: Payer: Self-pay | Admitting: Family Medicine

## 2014-12-23 DIAGNOSIS — Z9181 History of falling: Secondary | ICD-10-CM | POA: Diagnosis not present

## 2014-12-23 DIAGNOSIS — M8580 Other specified disorders of bone density and structure, unspecified site: Secondary | ICD-10-CM

## 2014-12-23 DIAGNOSIS — E780 Pure hypercholesterolemia: Secondary | ICD-10-CM | POA: Diagnosis not present

## 2014-12-23 DIAGNOSIS — E039 Hypothyroidism, unspecified: Secondary | ICD-10-CM | POA: Diagnosis not present

## 2014-12-23 DIAGNOSIS — Z6828 Body mass index (BMI) 28.0-28.9, adult: Secondary | ICD-10-CM | POA: Diagnosis not present

## 2014-12-23 DIAGNOSIS — E2839 Other primary ovarian failure: Secondary | ICD-10-CM | POA: Diagnosis not present

## 2014-12-23 DIAGNOSIS — Z1389 Encounter for screening for other disorder: Secondary | ICD-10-CM | POA: Diagnosis not present

## 2015-02-09 ENCOUNTER — Other Ambulatory Visit: Payer: Medicare PPO

## 2015-03-10 DIAGNOSIS — R202 Paresthesia of skin: Secondary | ICD-10-CM | POA: Diagnosis not present

## 2015-04-07 ENCOUNTER — Other Ambulatory Visit: Payer: Self-pay | Admitting: *Deleted

## 2015-04-07 DIAGNOSIS — G609 Hereditary and idiopathic neuropathy, unspecified: Secondary | ICD-10-CM

## 2015-04-08 DIAGNOSIS — R202 Paresthesia of skin: Secondary | ICD-10-CM | POA: Diagnosis not present

## 2015-04-21 ENCOUNTER — Encounter: Payer: Medicare PPO | Admitting: Neurology

## 2015-07-10 ENCOUNTER — Ambulatory Visit
Admission: RE | Admit: 2015-07-10 | Discharge: 2015-07-10 | Disposition: A | Discharge status: Home / Self Care | Payer: Medicare Other | Source: Ambulatory Visit | Attending: Family Medicine | Admitting: Family Medicine

## 2015-07-10 DIAGNOSIS — M8580 Other specified disorders of bone density and structure, unspecified site: Secondary | ICD-10-CM

## 2017-03-10 ENCOUNTER — Other Ambulatory Visit: Payer: Self-pay | Admitting: Physician Assistant

## 2017-03-10 DIAGNOSIS — Z1231 Encounter for screening mammogram for malignant neoplasm of breast: Secondary | ICD-10-CM

## 2017-04-07 ENCOUNTER — Ambulatory Visit: Payer: Medicare Other

## 2017-05-08 ENCOUNTER — Ambulatory Visit: Payer: Medicare Other

## 2017-05-26 ENCOUNTER — Ambulatory Visit
Admission: RE | Admit: 2017-05-26 | Discharge: 2017-05-26 | Disposition: A | Discharge status: Home / Self Care | Payer: Medicare Other | Source: Ambulatory Visit | Attending: Physician Assistant | Admitting: Physician Assistant

## 2017-05-26 DIAGNOSIS — Z1231 Encounter for screening mammogram for malignant neoplasm of breast: Secondary | ICD-10-CM

## 2019-02-05 ENCOUNTER — Other Ambulatory Visit: Payer: Self-pay | Admitting: Physician Assistant

## 2019-02-05 DIAGNOSIS — R5381 Other malaise: Secondary | ICD-10-CM

## 2019-02-05 DIAGNOSIS — Z1231 Encounter for screening mammogram for malignant neoplasm of breast: Secondary | ICD-10-CM

## 2019-02-11 ENCOUNTER — Other Ambulatory Visit: Payer: Self-pay | Admitting: Physician Assistant

## 2019-02-11 DIAGNOSIS — E2839 Other primary ovarian failure: Secondary | ICD-10-CM

## 2019-05-16 ENCOUNTER — Other Ambulatory Visit: Payer: Self-pay

## 2019-05-16 ENCOUNTER — Ambulatory Visit
Admission: RE | Admit: 2019-05-16 | Discharge: 2019-05-16 | Disposition: A | Discharge status: Home / Self Care | Payer: Medicare Other | Source: Ambulatory Visit | Attending: Physician Assistant | Admitting: Physician Assistant

## 2019-05-16 DIAGNOSIS — E2839 Other primary ovarian failure: Secondary | ICD-10-CM

## 2019-05-16 DIAGNOSIS — Z1231 Encounter for screening mammogram for malignant neoplasm of breast: Secondary | ICD-10-CM

## 2020-08-06 ENCOUNTER — Other Ambulatory Visit: Payer: Self-pay | Admitting: Physician Assistant

## 2020-08-06 DIAGNOSIS — Z1231 Encounter for screening mammogram for malignant neoplasm of breast: Secondary | ICD-10-CM

## 2020-08-24 ENCOUNTER — Other Ambulatory Visit: Payer: Self-pay

## 2020-08-24 ENCOUNTER — Ambulatory Visit
Admission: RE | Admit: 2020-08-24 | Discharge: 2020-08-24 | Disposition: A | Discharge status: Home / Self Care | Payer: Medicare PPO | Source: Ambulatory Visit | Attending: Physician Assistant | Admitting: Physician Assistant

## 2020-08-24 DIAGNOSIS — Z1231 Encounter for screening mammogram for malignant neoplasm of breast: Secondary | ICD-10-CM | POA: Diagnosis not present

## 2020-11-06 DIAGNOSIS — Z139 Encounter for screening, unspecified: Secondary | ICD-10-CM | POA: Diagnosis not present

## 2020-11-06 DIAGNOSIS — E559 Vitamin D deficiency, unspecified: Secondary | ICD-10-CM | POA: Diagnosis not present

## 2020-11-06 DIAGNOSIS — E7800 Pure hypercholesterolemia, unspecified: Secondary | ICD-10-CM | POA: Diagnosis not present

## 2020-11-06 DIAGNOSIS — Z9181 History of falling: Secondary | ICD-10-CM | POA: Diagnosis not present

## 2020-11-06 DIAGNOSIS — Z1331 Encounter for screening for depression: Secondary | ICD-10-CM | POA: Diagnosis not present

## 2020-11-06 DIAGNOSIS — E039 Hypothyroidism, unspecified: Secondary | ICD-10-CM | POA: Diagnosis not present

## 2020-11-06 DIAGNOSIS — Z1211 Encounter for screening for malignant neoplasm of colon: Secondary | ICD-10-CM | POA: Diagnosis not present

## 2020-11-06 DIAGNOSIS — Z6829 Body mass index (BMI) 29.0-29.9, adult: Secondary | ICD-10-CM | POA: Diagnosis not present

## 2020-11-06 DIAGNOSIS — R2689 Other abnormalities of gait and mobility: Secondary | ICD-10-CM | POA: Diagnosis not present

## 2020-11-06 DIAGNOSIS — D509 Iron deficiency anemia, unspecified: Secondary | ICD-10-CM | POA: Diagnosis not present

## 2021-01-01 DIAGNOSIS — Z6829 Body mass index (BMI) 29.0-29.9, adult: Secondary | ICD-10-CM | POA: Diagnosis not present

## 2021-01-01 DIAGNOSIS — B373 Candidiasis of vulva and vagina: Secondary | ICD-10-CM | POA: Diagnosis not present

## 2021-02-23 DIAGNOSIS — B372 Candidiasis of skin and nail: Secondary | ICD-10-CM | POA: Diagnosis not present

## 2021-02-23 DIAGNOSIS — Z6829 Body mass index (BMI) 29.0-29.9, adult: Secondary | ICD-10-CM | POA: Diagnosis not present

## 2021-04-14 DIAGNOSIS — L209 Atopic dermatitis, unspecified: Secondary | ICD-10-CM | POA: Diagnosis not present

## 2021-04-14 DIAGNOSIS — Z6829 Body mass index (BMI) 29.0-29.9, adult: Secondary | ICD-10-CM | POA: Diagnosis not present

## 2021-04-14 DIAGNOSIS — B356 Tinea cruris: Secondary | ICD-10-CM | POA: Diagnosis not present

## 2021-04-14 DIAGNOSIS — L299 Pruritus, unspecified: Secondary | ICD-10-CM | POA: Diagnosis not present

## 2021-04-14 DIAGNOSIS — B372 Candidiasis of skin and nail: Secondary | ICD-10-CM | POA: Diagnosis not present

## 2021-04-14 DIAGNOSIS — N898 Other specified noninflammatory disorders of vagina: Secondary | ICD-10-CM | POA: Diagnosis not present

## 2021-04-14 DIAGNOSIS — E1122 Type 2 diabetes mellitus with diabetic chronic kidney disease: Secondary | ICD-10-CM | POA: Diagnosis not present

## 2021-04-20 DIAGNOSIS — R030 Elevated blood-pressure reading, without diagnosis of hypertension: Secondary | ICD-10-CM | POA: Diagnosis not present

## 2021-04-20 DIAGNOSIS — E785 Hyperlipidemia, unspecified: Secondary | ICD-10-CM | POA: Diagnosis not present

## 2021-04-20 DIAGNOSIS — Z818 Family history of other mental and behavioral disorders: Secondary | ICD-10-CM | POA: Diagnosis not present

## 2021-04-20 DIAGNOSIS — Z8249 Family history of ischemic heart disease and other diseases of the circulatory system: Secondary | ICD-10-CM | POA: Diagnosis not present

## 2021-04-20 DIAGNOSIS — E039 Hypothyroidism, unspecified: Secondary | ICD-10-CM | POA: Diagnosis not present

## 2021-04-20 DIAGNOSIS — M8580 Other specified disorders of bone density and structure, unspecified site: Secondary | ICD-10-CM | POA: Diagnosis not present

## 2021-05-14 DIAGNOSIS — E559 Vitamin D deficiency, unspecified: Secondary | ICD-10-CM | POA: Diagnosis not present

## 2021-05-14 DIAGNOSIS — M8580 Other specified disorders of bone density and structure, unspecified site: Secondary | ICD-10-CM | POA: Diagnosis not present

## 2021-05-14 DIAGNOSIS — Z6829 Body mass index (BMI) 29.0-29.9, adult: Secondary | ICD-10-CM | POA: Diagnosis not present

## 2021-05-14 DIAGNOSIS — E039 Hypothyroidism, unspecified: Secondary | ICD-10-CM | POA: Diagnosis not present

## 2021-05-14 DIAGNOSIS — E7800 Pure hypercholesterolemia, unspecified: Secondary | ICD-10-CM | POA: Diagnosis not present

## 2021-05-14 DIAGNOSIS — D509 Iron deficiency anemia, unspecified: Secondary | ICD-10-CM | POA: Diagnosis not present

## 2021-05-14 DIAGNOSIS — L309 Dermatitis, unspecified: Secondary | ICD-10-CM | POA: Diagnosis not present

## 2021-05-18 DIAGNOSIS — E039 Hypothyroidism, unspecified: Secondary | ICD-10-CM | POA: Diagnosis not present

## 2021-05-18 DIAGNOSIS — D509 Iron deficiency anemia, unspecified: Secondary | ICD-10-CM | POA: Diagnosis not present

## 2021-05-18 DIAGNOSIS — E559 Vitamin D deficiency, unspecified: Secondary | ICD-10-CM | POA: Diagnosis not present

## 2021-05-25 DIAGNOSIS — Z6829 Body mass index (BMI) 29.0-29.9, adult: Secondary | ICD-10-CM | POA: Diagnosis not present

## 2021-05-25 DIAGNOSIS — R21 Rash and other nonspecific skin eruption: Secondary | ICD-10-CM | POA: Diagnosis not present

## 2021-09-06 DIAGNOSIS — R531 Weakness: Secondary | ICD-10-CM | POA: Diagnosis not present

## 2021-09-06 DIAGNOSIS — K625 Hemorrhage of anus and rectum: Secondary | ICD-10-CM | POA: Diagnosis not present

## 2021-09-06 DIAGNOSIS — D649 Anemia, unspecified: Secondary | ICD-10-CM | POA: Diagnosis not present

## 2021-09-07 DIAGNOSIS — D649 Anemia, unspecified: Secondary | ICD-10-CM | POA: Diagnosis not present

## 2021-09-07 DIAGNOSIS — K5900 Constipation, unspecified: Secondary | ICD-10-CM | POA: Diagnosis not present

## 2021-09-13 DIAGNOSIS — D649 Anemia, unspecified: Secondary | ICD-10-CM | POA: Diagnosis not present

## 2021-09-13 DIAGNOSIS — K625 Hemorrhage of anus and rectum: Secondary | ICD-10-CM | POA: Diagnosis not present

## 2021-10-06 DIAGNOSIS — G629 Polyneuropathy, unspecified: Secondary | ICD-10-CM | POA: Diagnosis not present

## 2021-10-06 DIAGNOSIS — E039 Hypothyroidism, unspecified: Secondary | ICD-10-CM | POA: Diagnosis not present

## 2021-10-06 DIAGNOSIS — D509 Iron deficiency anemia, unspecified: Secondary | ICD-10-CM | POA: Diagnosis not present

## 2021-10-07 DIAGNOSIS — D509 Iron deficiency anemia, unspecified: Secondary | ICD-10-CM | POA: Diagnosis not present

## 2021-10-07 DIAGNOSIS — G629 Polyneuropathy, unspecified: Secondary | ICD-10-CM | POA: Diagnosis not present

## 2021-10-07 DIAGNOSIS — E039 Hypothyroidism, unspecified: Secondary | ICD-10-CM | POA: Diagnosis not present

## 2021-10-19 DIAGNOSIS — K449 Diaphragmatic hernia without obstruction or gangrene: Secondary | ICD-10-CM | POA: Diagnosis not present

## 2021-10-19 DIAGNOSIS — K317 Polyp of stomach and duodenum: Secondary | ICD-10-CM | POA: Diagnosis not present

## 2021-10-19 DIAGNOSIS — D649 Anemia, unspecified: Secondary | ICD-10-CM | POA: Diagnosis not present

## 2021-10-19 DIAGNOSIS — K921 Melena: Secondary | ICD-10-CM | POA: Diagnosis not present

## 2021-10-19 DIAGNOSIS — K644 Residual hemorrhoidal skin tags: Secondary | ICD-10-CM | POA: Diagnosis not present

## 2021-10-19 DIAGNOSIS — E039 Hypothyroidism, unspecified: Secondary | ICD-10-CM | POA: Diagnosis not present

## 2021-10-19 DIAGNOSIS — Z79899 Other long term (current) drug therapy: Secondary | ICD-10-CM | POA: Diagnosis not present

## 2021-11-12 DIAGNOSIS — Z9181 History of falling: Secondary | ICD-10-CM | POA: Diagnosis not present

## 2021-11-12 DIAGNOSIS — E7800 Pure hypercholesterolemia, unspecified: Secondary | ICD-10-CM | POA: Diagnosis not present

## 2021-11-12 DIAGNOSIS — G629 Polyneuropathy, unspecified: Secondary | ICD-10-CM | POA: Diagnosis not present

## 2021-11-12 DIAGNOSIS — D509 Iron deficiency anemia, unspecified: Secondary | ICD-10-CM | POA: Diagnosis not present

## 2021-11-12 DIAGNOSIS — E559 Vitamin D deficiency, unspecified: Secondary | ICD-10-CM | POA: Diagnosis not present

## 2021-11-12 DIAGNOSIS — M8580 Other specified disorders of bone density and structure, unspecified site: Secondary | ICD-10-CM | POA: Diagnosis not present

## 2021-11-12 DIAGNOSIS — Z1331 Encounter for screening for depression: Secondary | ICD-10-CM | POA: Diagnosis not present

## 2021-11-12 DIAGNOSIS — D519 Vitamin B12 deficiency anemia, unspecified: Secondary | ICD-10-CM | POA: Diagnosis not present

## 2021-11-12 DIAGNOSIS — Z139 Encounter for screening, unspecified: Secondary | ICD-10-CM | POA: Diagnosis not present

## 2021-11-12 DIAGNOSIS — E039 Hypothyroidism, unspecified: Secondary | ICD-10-CM | POA: Diagnosis not present

## 2021-12-21 DIAGNOSIS — K649 Unspecified hemorrhoids: Secondary | ICD-10-CM | POA: Diagnosis not present

## 2021-12-21 DIAGNOSIS — D649 Anemia, unspecified: Secondary | ICD-10-CM | POA: Diagnosis not present

## 2021-12-21 DIAGNOSIS — K5900 Constipation, unspecified: Secondary | ICD-10-CM | POA: Diagnosis not present

## 2021-12-21 DIAGNOSIS — D510 Vitamin B12 deficiency anemia due to intrinsic factor deficiency: Secondary | ICD-10-CM | POA: Diagnosis not present

## 2022-01-03 DIAGNOSIS — D519 Vitamin B12 deficiency anemia, unspecified: Secondary | ICD-10-CM | POA: Diagnosis not present

## 2022-01-10 DIAGNOSIS — D519 Vitamin B12 deficiency anemia, unspecified: Secondary | ICD-10-CM | POA: Diagnosis not present

## 2022-01-17 DIAGNOSIS — D519 Vitamin B12 deficiency anemia, unspecified: Secondary | ICD-10-CM | POA: Diagnosis not present

## 2022-01-24 DIAGNOSIS — D519 Vitamin B12 deficiency anemia, unspecified: Secondary | ICD-10-CM | POA: Diagnosis not present

## 2022-01-31 DIAGNOSIS — D519 Vitamin B12 deficiency anemia, unspecified: Secondary | ICD-10-CM | POA: Diagnosis not present

## 2022-02-07 DIAGNOSIS — D519 Vitamin B12 deficiency anemia, unspecified: Secondary | ICD-10-CM | POA: Diagnosis not present

## 2022-02-10 DIAGNOSIS — M25532 Pain in left wrist: Secondary | ICD-10-CM | POA: Diagnosis not present

## 2022-02-14 DIAGNOSIS — E559 Vitamin D deficiency, unspecified: Secondary | ICD-10-CM | POA: Diagnosis not present

## 2022-02-14 DIAGNOSIS — D519 Vitamin B12 deficiency anemia, unspecified: Secondary | ICD-10-CM | POA: Diagnosis not present

## 2022-02-14 DIAGNOSIS — G629 Polyneuropathy, unspecified: Secondary | ICD-10-CM | POA: Diagnosis not present

## 2022-02-14 DIAGNOSIS — E039 Hypothyroidism, unspecified: Secondary | ICD-10-CM | POA: Diagnosis not present

## 2022-02-14 DIAGNOSIS — E7800 Pure hypercholesterolemia, unspecified: Secondary | ICD-10-CM | POA: Diagnosis not present

## 2022-02-14 DIAGNOSIS — M8580 Other specified disorders of bone density and structure, unspecified site: Secondary | ICD-10-CM | POA: Diagnosis not present

## 2022-02-14 DIAGNOSIS — Z1231 Encounter for screening mammogram for malignant neoplasm of breast: Secondary | ICD-10-CM | POA: Diagnosis not present

## 2022-02-15 ENCOUNTER — Other Ambulatory Visit: Payer: Self-pay | Admitting: Physician Assistant

## 2022-02-15 DIAGNOSIS — Z1231 Encounter for screening mammogram for malignant neoplasm of breast: Secondary | ICD-10-CM

## 2022-02-15 DIAGNOSIS — M8580 Other specified disorders of bone density and structure, unspecified site: Secondary | ICD-10-CM

## 2022-03-03 DIAGNOSIS — M25532 Pain in left wrist: Secondary | ICD-10-CM | POA: Diagnosis not present

## 2022-03-21 DIAGNOSIS — D519 Vitamin B12 deficiency anemia, unspecified: Secondary | ICD-10-CM | POA: Diagnosis not present

## 2022-03-31 DIAGNOSIS — M25532 Pain in left wrist: Secondary | ICD-10-CM | POA: Diagnosis not present

## 2022-04-04 ENCOUNTER — Encounter: Payer: Self-pay | Admitting: Diagnostic Neuroimaging

## 2022-04-04 ENCOUNTER — Ambulatory Visit: Payer: Medicare PPO | Admitting: Diagnostic Neuroimaging

## 2022-04-04 VITALS — BP 146/92 | HR 83 | Ht 64.0 in | Wt 172.6 lb

## 2022-04-04 DIAGNOSIS — G63 Polyneuropathy in diseases classified elsewhere: Secondary | ICD-10-CM | POA: Diagnosis not present

## 2022-04-04 DIAGNOSIS — E538 Deficiency of other specified B group vitamins: Secondary | ICD-10-CM | POA: Diagnosis not present

## 2022-04-04 NOTE — Progress Notes (Signed)
[facility]  PATIENT: Brenda Huffman DOB: 1946-04-22  REFERRING CLINICIAN: Lonie Peak, PA-C HISTORY FROM: patient REASON FOR VISIT: new consult   HISTORICAL  CHIEF COMPLAINT:  Chief Complaint  Patient presents with   Room 7    Pt is here Alone. Pt states that her symptoms started July. Pt states that she has experience numbness in both of her feet. Pt states no burning or tingling. Pt states that the numbness never goes away.     HISTORY OF PRESENT ILLNESS:   76 year old female here for evaluation of numbness and tingling and pain in toes feet and ankles since July 2023.  Pain is minimal.  Symptoms are somewhat annoying.  Has history of carpal tunnel syndrome status post surgeries.  Patient went to PCP and was diagnosed with severe B12 deficiency, started on therapy for last 2 months.  Symptoms slightly improving.   REVIEW OF SYSTEMS: Full 14 system review of systems performed and negative with exception of: as per HPI.  ALLERGIES: No Known Allergies  HOME MEDICATIONS: Outpatient Medications Prior to Visit  Medication Sig Dispense Refill   gabapentin (NEURONTIN) 300 MG capsule Take 300 mg by mouth 3 (three) times daily.     levothyroxine (SYNTHROID, LEVOTHROID) 137 MCG tablet Take 137 mcg by mouth daily before breakfast.     No facility-administered medications prior to visit.    PAST MEDICAL HISTORY: Past Medical History:  Diagnosis Date   Thyroid disease     PAST SURGICAL HISTORY: Past Surgical History:  Procedure Laterality Date   ABDOMINAL HYSTERECTOMY  1993   APPENDECTOMY  1964    FAMILY HISTORY: Family History  Problem Relation Age of Onset   Dementia Mother    Stroke Mother        Died, 7   Dementia Father    Stroke Father        Died, 46s   Healthy Sister    Healthy Brother     SOCIAL HISTORY: Social History   Socioeconomic History   Marital status: Widowed    Spouse name: Not on file   Number of children: Not  on file   Years of education: Not on file   Highest education level: Not on file  Occupational History   Not on file  Tobacco Use   Smoking status: Never   Smokeless tobacco: Never  Substance and Sexual Activity   Alcohol use: No   Drug use: No   Sexual activity: Not on file  Other Topics Concern   Not on file  Social History Narrative   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   Lives with husband.  They have two healthy sons.   Social Determinants of Health   Financial Resource Strain: Not on file  Food Insecurity: Not on file  Transportation Needs: Not on file  Physical Activity: Not on file  Stress: Not on file  Social Connections: Not on file  Intimate Partner Violence: Not on file     PHYSICAL EXAM  GENERAL EXAM/CONSTITUTIONAL: Vitals:  Vitals:   04/04/22 1112  BP: (!) 146/92  Pulse: 83  Weight: 172 lb 9.6 oz (78.3 kg)  Height: 5\' 4"  (1.626 m)   Body mass index is 29.63 kg/m. Wt Readings from Last 3 Encounters:  04/04/22 172 lb 9.6 oz (78.3 kg)  04/10/13 140 lb 3.2 oz (63.6 kg)  02/15/13 141 lb 12.8 oz (64.3 kg)   Patient is in no distress; well developed, nourished and groomed; neck is  supple  CARDIOVASCULAR: Examination of carotid arteries is normal; no carotid bruits Regular rate and rhythm, no murmurs Examination of peripheral vascular system by observation and palpation is normal  EYES: Ophthalmoscopic exam of optic discs and posterior segments is normal; no papilledema or hemorrhages No results found.  MUSCULOSKELETAL: Gait, strength, tone, movements noted in Neurologic exam below  NEUROLOGIC: MENTAL STATUS:      No data to display         awake, alert, oriented to person, place and time recent and remote memory intact normal attention and concentration language fluent, comprehension intact, naming intact fund of knowledge appropriate  CRANIAL NERVE:  2nd - no papilledema on fundoscopic exam 2nd, 3rd,  4th, 6th - pupils equal and reactive to light, visual fields full to confrontation, extraocular muscles intact, no nystagmus 5th - facial sensation symmetric 7th - facial strength symmetric 8th - hearing intact 9th - palate elevates symmetrically, uvula midline 11th - shoulder shrug symmetric 12th - tongue protrusion midline  MOTOR:  normal bulk and tone, full strength in the BUE, BLE  SENSORY:  normal and symmetric to light touch, temperature, vibration  COORDINATION:  finger-nose-finger, fine finger movements normal  REFLEXES:  deep tendon reflexes trace and symmetric  GAIT/STATION:  narrow based gait     DIAGNOSTIC DATA (LABS, IMAGING, TESTING) - I reviewed patient records, labs, notes, testing and imaging myself where available.  No results found for: "WBC", "HGB", "HCT", "MCV", "PLT" No results found for: "NA", "K", "CL", "CO2", "GLUCOSE", "BUN", "CREATININE", "CALCIUM", "PROT", "ALBUMIN", "AST", "ALT", "ALKPHOS", "BILITOT", "GFRNONAA", "GFRAA" No results found for: "CHOL", "HDL", "LDLCALC", "LDLDIRECT", "TRIG", "CHOLHDL" No results found for: "HGBA1C" No results found for: "VITAMINB12" No results found for: "TSH"  June 2023 B12 --> 50 (low)    ASSESSMENT AND PLAN  76 y.o. year old female here with:   Dx:  1. Vitamin B12 deficiency neuropathy (HCC)     PLAN:  SEVERE B12 deficiency --> neuropathy - continue B12 replacement; numbness in feet will take time to improve (can take months - years) - gabapentin for pain control  Return for return to PCP, pending if symptoms worsen or fail to improve.    Suanne Marker, MD 04/04/2022, 11:41 AM Certified in Neurology, Neurophysiology and Neuroimaging  [facility] [address] [place], [place] [address] [phone]

## 2022-04-21 DIAGNOSIS — D519 Vitamin B12 deficiency anemia, unspecified: Secondary | ICD-10-CM | POA: Diagnosis not present

## 2022-05-03 ENCOUNTER — Ambulatory Visit: Payer: Medicare PPO

## 2022-05-23 DIAGNOSIS — E559 Vitamin D deficiency, unspecified: Secondary | ICD-10-CM | POA: Diagnosis not present

## 2022-05-23 DIAGNOSIS — E039 Hypothyroidism, unspecified: Secondary | ICD-10-CM | POA: Diagnosis not present

## 2022-05-23 DIAGNOSIS — M8580 Other specified disorders of bone density and structure, unspecified site: Secondary | ICD-10-CM | POA: Diagnosis not present

## 2022-05-23 DIAGNOSIS — D519 Vitamin B12 deficiency anemia, unspecified: Secondary | ICD-10-CM | POA: Diagnosis not present

## 2022-05-23 DIAGNOSIS — E7800 Pure hypercholesterolemia, unspecified: Secondary | ICD-10-CM | POA: Diagnosis not present

## 2022-05-23 DIAGNOSIS — G629 Polyneuropathy, unspecified: Secondary | ICD-10-CM | POA: Diagnosis not present

## 2022-06-24 DIAGNOSIS — G629 Polyneuropathy, unspecified: Secondary | ICD-10-CM | POA: Diagnosis not present

## 2022-06-24 DIAGNOSIS — D519 Vitamin B12 deficiency anemia, unspecified: Secondary | ICD-10-CM | POA: Diagnosis not present

## 2022-07-25 DIAGNOSIS — D519 Vitamin B12 deficiency anemia, unspecified: Secondary | ICD-10-CM | POA: Diagnosis not present

## 2022-07-25 DIAGNOSIS — E7800 Pure hypercholesterolemia, unspecified: Secondary | ICD-10-CM | POA: Diagnosis not present

## 2022-07-25 DIAGNOSIS — G629 Polyneuropathy, unspecified: Secondary | ICD-10-CM | POA: Diagnosis not present

## 2022-08-11 ENCOUNTER — Ambulatory Visit
Admission: RE | Admit: 2022-08-11 | Discharge: 2022-08-11 | Disposition: A | Discharge status: Home / Self Care | Payer: Medicare PPO | Source: Ambulatory Visit | Attending: Physician Assistant | Admitting: Physician Assistant

## 2022-08-11 DIAGNOSIS — Z1231 Encounter for screening mammogram for malignant neoplasm of breast: Secondary | ICD-10-CM

## 2022-08-16 ENCOUNTER — Other Ambulatory Visit: Payer: Self-pay | Admitting: Physician Assistant

## 2022-08-16 DIAGNOSIS — M8580 Other specified disorders of bone density and structure, unspecified site: Secondary | ICD-10-CM

## 2022-08-18 ENCOUNTER — Other Ambulatory Visit: Payer: Medicare PPO

## 2022-09-26 DIAGNOSIS — E039 Hypothyroidism, unspecified: Secondary | ICD-10-CM | POA: Diagnosis not present

## 2022-09-26 DIAGNOSIS — E7800 Pure hypercholesterolemia, unspecified: Secondary | ICD-10-CM | POA: Diagnosis not present

## 2022-09-26 DIAGNOSIS — G629 Polyneuropathy, unspecified: Secondary | ICD-10-CM | POA: Diagnosis not present

## 2022-09-26 DIAGNOSIS — D519 Vitamin B12 deficiency anemia, unspecified: Secondary | ICD-10-CM | POA: Diagnosis not present

## 2022-09-28 DIAGNOSIS — I129 Hypertensive chronic kidney disease with stage 1 through stage 4 chronic kidney disease, or unspecified chronic kidney disease: Secondary | ICD-10-CM | POA: Diagnosis not present

## 2022-09-28 DIAGNOSIS — M8580 Other specified disorders of bone density and structure, unspecified site: Secondary | ICD-10-CM | POA: Diagnosis not present

## 2022-09-28 DIAGNOSIS — N182 Chronic kidney disease, stage 2 (mild): Secondary | ICD-10-CM | POA: Diagnosis not present

## 2022-09-28 DIAGNOSIS — R269 Unspecified abnormalities of gait and mobility: Secondary | ICD-10-CM | POA: Diagnosis not present

## 2022-09-28 DIAGNOSIS — E785 Hyperlipidemia, unspecified: Secondary | ICD-10-CM | POA: Diagnosis not present

## 2022-09-28 DIAGNOSIS — K5900 Constipation, unspecified: Secondary | ICD-10-CM | POA: Diagnosis not present

## 2022-09-28 DIAGNOSIS — E669 Obesity, unspecified: Secondary | ICD-10-CM | POA: Diagnosis not present

## 2022-09-28 DIAGNOSIS — M1990 Unspecified osteoarthritis, unspecified site: Secondary | ICD-10-CM | POA: Diagnosis not present

## 2022-09-28 DIAGNOSIS — E039 Hypothyroidism, unspecified: Secondary | ICD-10-CM | POA: Diagnosis not present

## 2022-11-15 DIAGNOSIS — L602 Onychogryphosis: Secondary | ICD-10-CM | POA: Diagnosis not present

## 2022-11-15 DIAGNOSIS — E7800 Pure hypercholesterolemia, unspecified: Secondary | ICD-10-CM | POA: Diagnosis not present

## 2022-11-15 DIAGNOSIS — Z139 Encounter for screening, unspecified: Secondary | ICD-10-CM | POA: Diagnosis not present

## 2022-11-15 DIAGNOSIS — D519 Vitamin B12 deficiency anemia, unspecified: Secondary | ICD-10-CM | POA: Diagnosis not present

## 2022-11-15 DIAGNOSIS — Z9181 History of falling: Secondary | ICD-10-CM | POA: Diagnosis not present

## 2022-11-15 DIAGNOSIS — Z1331 Encounter for screening for depression: Secondary | ICD-10-CM | POA: Diagnosis not present

## 2022-11-15 DIAGNOSIS — G629 Polyneuropathy, unspecified: Secondary | ICD-10-CM | POA: Diagnosis not present

## 2022-11-15 DIAGNOSIS — E039 Hypothyroidism, unspecified: Secondary | ICD-10-CM | POA: Diagnosis not present

## 2023-01-16 DIAGNOSIS — E7800 Pure hypercholesterolemia, unspecified: Secondary | ICD-10-CM | POA: Diagnosis not present

## 2023-01-16 DIAGNOSIS — G629 Polyneuropathy, unspecified: Secondary | ICD-10-CM | POA: Diagnosis not present

## 2023-01-16 DIAGNOSIS — E039 Hypothyroidism, unspecified: Secondary | ICD-10-CM | POA: Diagnosis not present

## 2023-01-16 DIAGNOSIS — E559 Vitamin D deficiency, unspecified: Secondary | ICD-10-CM | POA: Diagnosis not present

## 2023-01-16 DIAGNOSIS — D519 Vitamin B12 deficiency anemia, unspecified: Secondary | ICD-10-CM | POA: Diagnosis not present

## 2023-02-24 ENCOUNTER — Ambulatory Visit
Admission: RE | Admit: 2023-02-24 | Discharge: 2023-02-24 | Disposition: A | Discharge status: Home / Self Care | Payer: Medicare PPO | Source: Ambulatory Visit | Attending: Physician Assistant | Admitting: Physician Assistant

## 2023-02-24 DIAGNOSIS — E2839 Other primary ovarian failure: Secondary | ICD-10-CM | POA: Diagnosis not present

## 2023-02-24 DIAGNOSIS — M8588 Other specified disorders of bone density and structure, other site: Secondary | ICD-10-CM | POA: Diagnosis not present

## 2023-02-24 DIAGNOSIS — Z90722 Acquired absence of ovaries, bilateral: Secondary | ICD-10-CM | POA: Diagnosis not present

## 2023-02-24 DIAGNOSIS — M8580 Other specified disorders of bone density and structure, unspecified site: Secondary | ICD-10-CM

## 2023-02-24 DIAGNOSIS — N958 Other specified menopausal and perimenopausal disorders: Secondary | ICD-10-CM | POA: Diagnosis not present

## 2023-04-22 DIAGNOSIS — N9489 Other specified conditions associated with female genital organs and menstrual cycle: Secondary | ICD-10-CM | POA: Diagnosis not present

## 2023-04-22 DIAGNOSIS — B3731 Acute candidiasis of vulva and vagina: Secondary | ICD-10-CM | POA: Diagnosis not present

## 2023-05-03 DIAGNOSIS — G629 Polyneuropathy, unspecified: Secondary | ICD-10-CM | POA: Diagnosis not present

## 2023-05-03 DIAGNOSIS — D519 Vitamin B12 deficiency anemia, unspecified: Secondary | ICD-10-CM | POA: Diagnosis not present

## 2023-05-03 DIAGNOSIS — E7800 Pure hypercholesterolemia, unspecified: Secondary | ICD-10-CM | POA: Diagnosis not present

## 2023-05-03 DIAGNOSIS — L989 Disorder of the skin and subcutaneous tissue, unspecified: Secondary | ICD-10-CM | POA: Diagnosis not present

## 2023-05-03 DIAGNOSIS — M8580 Other specified disorders of bone density and structure, unspecified site: Secondary | ICD-10-CM | POA: Diagnosis not present

## 2023-05-03 DIAGNOSIS — E559 Vitamin D deficiency, unspecified: Secondary | ICD-10-CM | POA: Diagnosis not present

## 2023-05-03 DIAGNOSIS — E039 Hypothyroidism, unspecified: Secondary | ICD-10-CM | POA: Diagnosis not present

## 2023-05-09 DIAGNOSIS — D519 Vitamin B12 deficiency anemia, unspecified: Secondary | ICD-10-CM | POA: Diagnosis not present

## 2023-06-06 DIAGNOSIS — L989 Disorder of the skin and subcutaneous tissue, unspecified: Secondary | ICD-10-CM | POA: Diagnosis not present

## 2023-06-06 DIAGNOSIS — D519 Vitamin B12 deficiency anemia, unspecified: Secondary | ICD-10-CM | POA: Diagnosis not present

## 2023-06-26 DIAGNOSIS — C44319 Basal cell carcinoma of skin of other parts of face: Secondary | ICD-10-CM | POA: Diagnosis not present

## 2023-07-04 DIAGNOSIS — D519 Vitamin B12 deficiency anemia, unspecified: Secondary | ICD-10-CM | POA: Diagnosis not present

## 2023-08-08 DIAGNOSIS — E039 Hypothyroidism, unspecified: Secondary | ICD-10-CM | POA: Diagnosis not present

## 2023-08-08 DIAGNOSIS — G629 Polyneuropathy, unspecified: Secondary | ICD-10-CM | POA: Diagnosis not present

## 2023-08-08 DIAGNOSIS — E559 Vitamin D deficiency, unspecified: Secondary | ICD-10-CM | POA: Diagnosis not present

## 2023-08-08 DIAGNOSIS — D519 Vitamin B12 deficiency anemia, unspecified: Secondary | ICD-10-CM | POA: Diagnosis not present

## 2023-08-08 DIAGNOSIS — E7800 Pure hypercholesterolemia, unspecified: Secondary | ICD-10-CM | POA: Diagnosis not present

## 2023-08-08 DIAGNOSIS — M8580 Other specified disorders of bone density and structure, unspecified site: Secondary | ICD-10-CM | POA: Diagnosis not present

## 2023-09-08 DIAGNOSIS — D519 Vitamin B12 deficiency anemia, unspecified: Secondary | ICD-10-CM | POA: Diagnosis not present

## 2023-10-11 DIAGNOSIS — D519 Vitamin B12 deficiency anemia, unspecified: Secondary | ICD-10-CM | POA: Diagnosis not present

## 2023-11-10 DIAGNOSIS — D519 Vitamin B12 deficiency anemia, unspecified: Secondary | ICD-10-CM | POA: Diagnosis not present

## 2023-11-10 DIAGNOSIS — M8580 Other specified disorders of bone density and structure, unspecified site: Secondary | ICD-10-CM | POA: Diagnosis not present

## 2023-11-10 DIAGNOSIS — G629 Polyneuropathy, unspecified: Secondary | ICD-10-CM | POA: Diagnosis not present

## 2023-11-10 DIAGNOSIS — E039 Hypothyroidism, unspecified: Secondary | ICD-10-CM | POA: Diagnosis not present

## 2023-11-10 DIAGNOSIS — E7800 Pure hypercholesterolemia, unspecified: Secondary | ICD-10-CM | POA: Diagnosis not present

## 2023-11-10 DIAGNOSIS — E559 Vitamin D deficiency, unspecified: Secondary | ICD-10-CM | POA: Diagnosis not present

## 2023-12-22 DIAGNOSIS — D519 Vitamin B12 deficiency anemia, unspecified: Secondary | ICD-10-CM | POA: Diagnosis not present

## 2024-02-02 DIAGNOSIS — D519 Vitamin B12 deficiency anemia, unspecified: Secondary | ICD-10-CM | POA: Diagnosis not present

## 2024-02-14 DIAGNOSIS — Z1331 Encounter for screening for depression: Secondary | ICD-10-CM | POA: Diagnosis not present

## 2024-02-14 DIAGNOSIS — D519 Vitamin B12 deficiency anemia, unspecified: Secondary | ICD-10-CM | POA: Diagnosis not present

## 2024-02-14 DIAGNOSIS — E559 Vitamin D deficiency, unspecified: Secondary | ICD-10-CM | POA: Diagnosis not present

## 2024-02-14 DIAGNOSIS — Z9181 History of falling: Secondary | ICD-10-CM | POA: Diagnosis not present

## 2024-02-14 DIAGNOSIS — G4700 Insomnia, unspecified: Secondary | ICD-10-CM | POA: Diagnosis not present

## 2024-02-14 DIAGNOSIS — M8580 Other specified disorders of bone density and structure, unspecified site: Secondary | ICD-10-CM | POA: Diagnosis not present

## 2024-02-14 DIAGNOSIS — E039 Hypothyroidism, unspecified: Secondary | ICD-10-CM | POA: Diagnosis not present

## 2024-02-14 DIAGNOSIS — G629 Polyneuropathy, unspecified: Secondary | ICD-10-CM | POA: Diagnosis not present

## 2024-02-14 DIAGNOSIS — E7800 Pure hypercholesterolemia, unspecified: Secondary | ICD-10-CM | POA: Diagnosis not present

## 2024-03-15 DIAGNOSIS — D519 Vitamin B12 deficiency anemia, unspecified: Secondary | ICD-10-CM | POA: Diagnosis not present
